# Patient Record
Sex: Male | Born: 1960 | Race: White | Hispanic: No | Marital: Married | Smoking: Never smoker
Health system: Southern US, Community
[De-identification: ages and names within clinical notes are randomized; demographics above are authoritative.]

## PROBLEM LIST (undated history)

## (undated) DIAGNOSIS — M5136 Other intervertebral disc degeneration, lumbar region: Secondary | ICD-10-CM

## (undated) DIAGNOSIS — Z86010 Personal history of colonic polyps: Principal | ICD-10-CM

## (undated) DIAGNOSIS — G4730 Sleep apnea, unspecified: Secondary | ICD-10-CM

## (undated) DIAGNOSIS — I10 Essential (primary) hypertension: Secondary | ICD-10-CM

## (undated) HISTORY — PX: COLONOSCOPY: SHX174

## (undated) HISTORY — DX: Personal history of colonic polyps: Z86.010

## (undated) HISTORY — DX: Sleep apnea, unspecified: G47.30

## (undated) HISTORY — DX: Essential (primary) hypertension: I10

## (undated) HISTORY — PX: CARPAL TUNNEL RELEASE: SHX101

## (undated) HISTORY — DX: Other intervertebral disc degeneration, lumbar region: M51.36

---

## 1997-11-18 ENCOUNTER — Ambulatory Visit (HOSPITAL_COMMUNITY): Admission: RE | Admit: 1997-11-18 | Discharge: 1997-11-18 | Payer: Self-pay

## 2001-11-30 ENCOUNTER — Ambulatory Visit (HOSPITAL_BASED_OUTPATIENT_CLINIC_OR_DEPARTMENT_OTHER): Admission: RE | Admit: 2001-11-30 | Discharge: 2001-11-30 | Payer: Self-pay

## 2003-01-22 ENCOUNTER — Ambulatory Visit (HOSPITAL_BASED_OUTPATIENT_CLINIC_OR_DEPARTMENT_OTHER): Admission: RE | Admit: 2003-01-22 | Discharge: 2003-01-22 | Payer: Self-pay | Admitting: Orthopedic Surgery

## 2003-01-22 ENCOUNTER — Ambulatory Visit (HOSPITAL_COMMUNITY): Admission: RE | Admit: 2003-01-22 | Discharge: 2003-01-22 | Payer: Self-pay | Admitting: Orthopedic Surgery

## 2003-02-22 HISTORY — PX: CHOLECYSTECTOMY: SHX55

## 2003-12-29 ENCOUNTER — Encounter: Admission: RE | Admit: 2003-12-29 | Discharge: 2003-12-29 | Payer: Self-pay | Admitting: Family Medicine

## 2005-06-22 ENCOUNTER — Encounter: Payer: Self-pay | Admitting: Cardiology

## 2012-07-17 ENCOUNTER — Encounter: Payer: Self-pay | Admitting: Internal Medicine

## 2012-07-25 ENCOUNTER — Ambulatory Visit
Admission: RE | Admit: 2012-07-25 | Discharge: 2012-07-25 | Disposition: A | Discharge status: Home / Self Care | Payer: BC Managed Care – PPO | Source: Ambulatory Visit | Attending: Family Medicine | Admitting: Family Medicine

## 2012-07-25 ENCOUNTER — Other Ambulatory Visit: Payer: Self-pay | Admitting: Family Medicine

## 2012-07-25 DIAGNOSIS — M545 Low back pain: Secondary | ICD-10-CM

## 2012-08-09 ENCOUNTER — Ambulatory Visit (AMBULATORY_SURGERY_CENTER): Payer: BC Managed Care – PPO | Admitting: *Deleted

## 2012-08-09 VITALS — Ht 69.0 in | Wt 322.4 lb

## 2012-08-09 DIAGNOSIS — Z1211 Encounter for screening for malignant neoplasm of colon: Secondary | ICD-10-CM

## 2012-08-09 MED ORDER — NA SULFATE-K SULFATE-MG SULF 17.5-3.13-1.6 GM/177ML PO SOLN: 1.0000 | Freq: Once | ORAL | Status: DC

## 2012-08-09 NOTE — Progress Notes (Signed)
Denies allergies to eggs or soy products. Denies complications with anesthesia or sedation. 

## 2012-08-10 ENCOUNTER — Encounter: Payer: Self-pay | Admitting: Internal Medicine

## 2012-08-23 ENCOUNTER — Ambulatory Visit: Payer: BC Managed Care – PPO | Admitting: Internal Medicine

## 2012-08-23 ENCOUNTER — Encounter: Payer: Self-pay | Admitting: Internal Medicine

## 2012-08-23 VITALS — BP 125/88 | HR 58 | Temp 97.3°F | Ht 69.0 in | Wt 322.0 lb

## 2012-08-23 MED ORDER — SODIUM CHLORIDE 0.9 % IV SOLN: 500.0000 mL | INTRAVENOUS | Status: DC

## 2012-08-23 NOTE — Progress Notes (Addendum)
Pt. Received call from wife during admission process, death of his mother. Dr. Leone Payor out to speak with pt.   He will reschedule at some point. Discharged with his son as care partner.  Agree Iva Boop, MD, [facility]

## 2012-10-03 ENCOUNTER — Encounter: Payer: Self-pay | Admitting: Internal Medicine

## 2012-12-27 ENCOUNTER — Telehealth: Payer: Self-pay

## 2012-12-27 NOTE — Telephone Encounter (Signed)
Left message on voice mail to call back so that we may set him up for a free pre-visit and colonoscopy with Dr. Stan Head.

## 2013-02-19 ENCOUNTER — Encounter: Payer: Self-pay | Admitting: Internal Medicine

## 2014-06-21 IMAGING — CR DG LUMBAR SPINE COMPLETE 4+V
5 series · 5 of 5 positions shown · non-contrast
Comparison: None.

CLINICAL DATA: Right-sided low back and right hip pain

LUMBAR SPINE - COMPLETE 4+ VIEW

[view not recorded (1 of 5)]
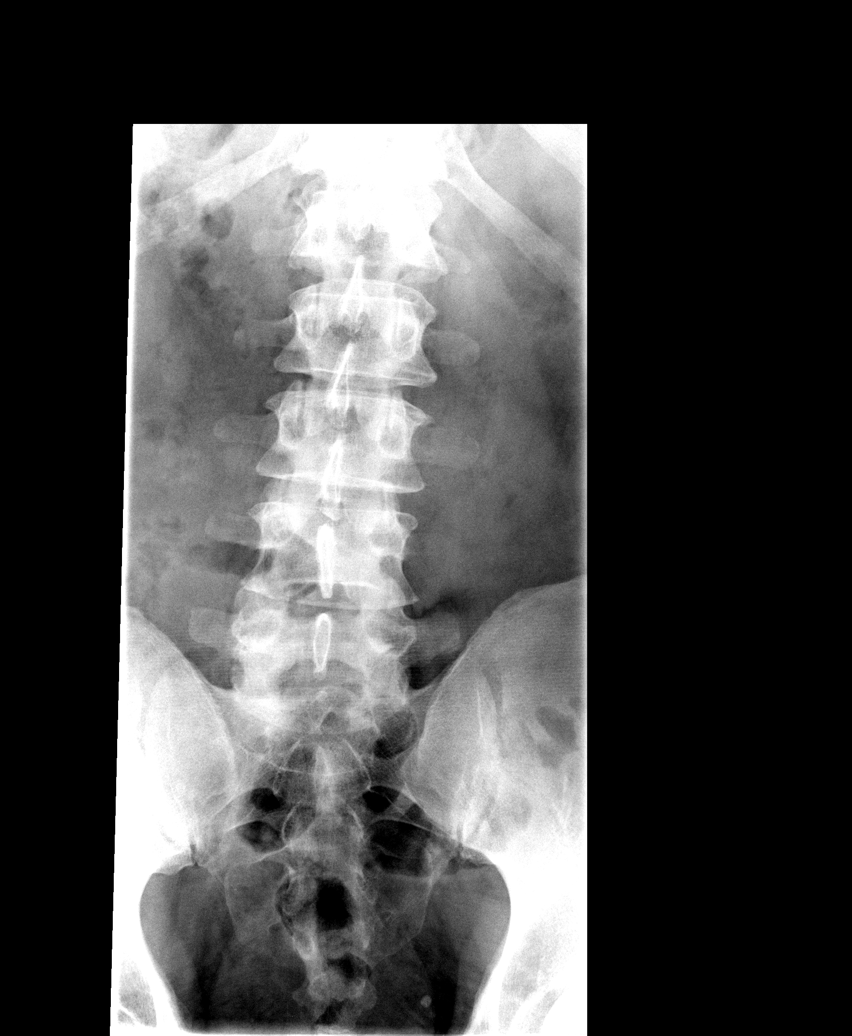

[view not recorded (2 of 5)]
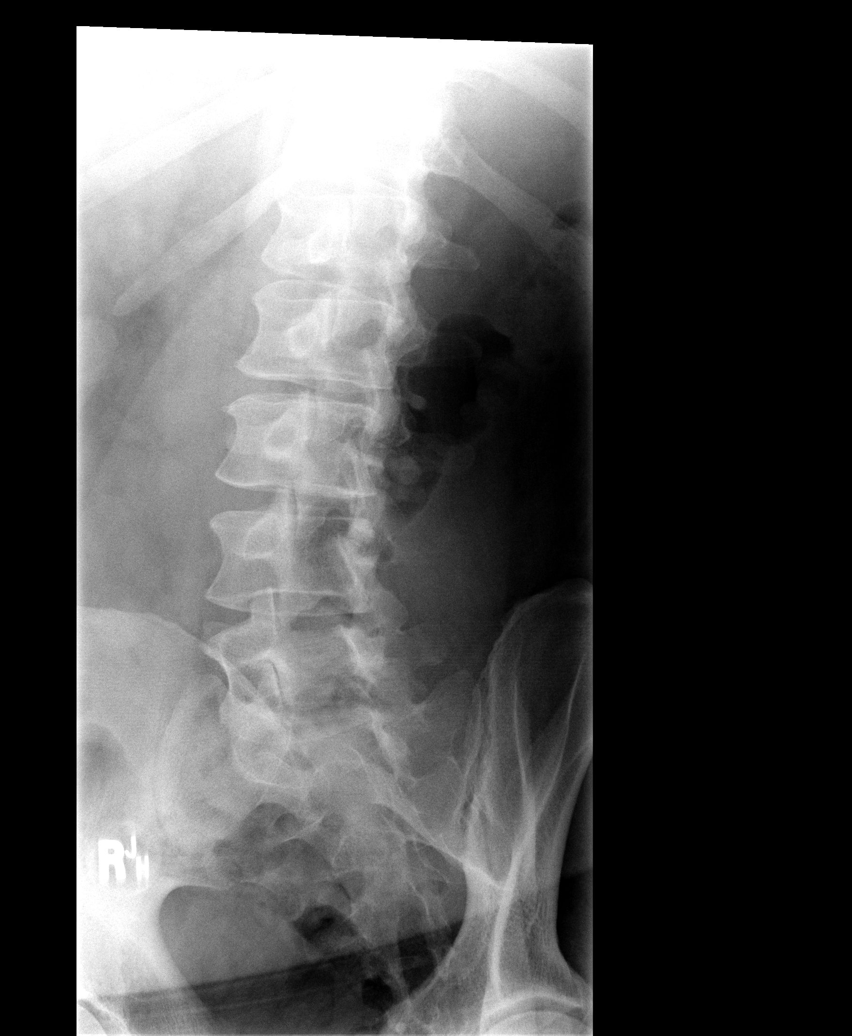

[view not recorded (3 of 5)]
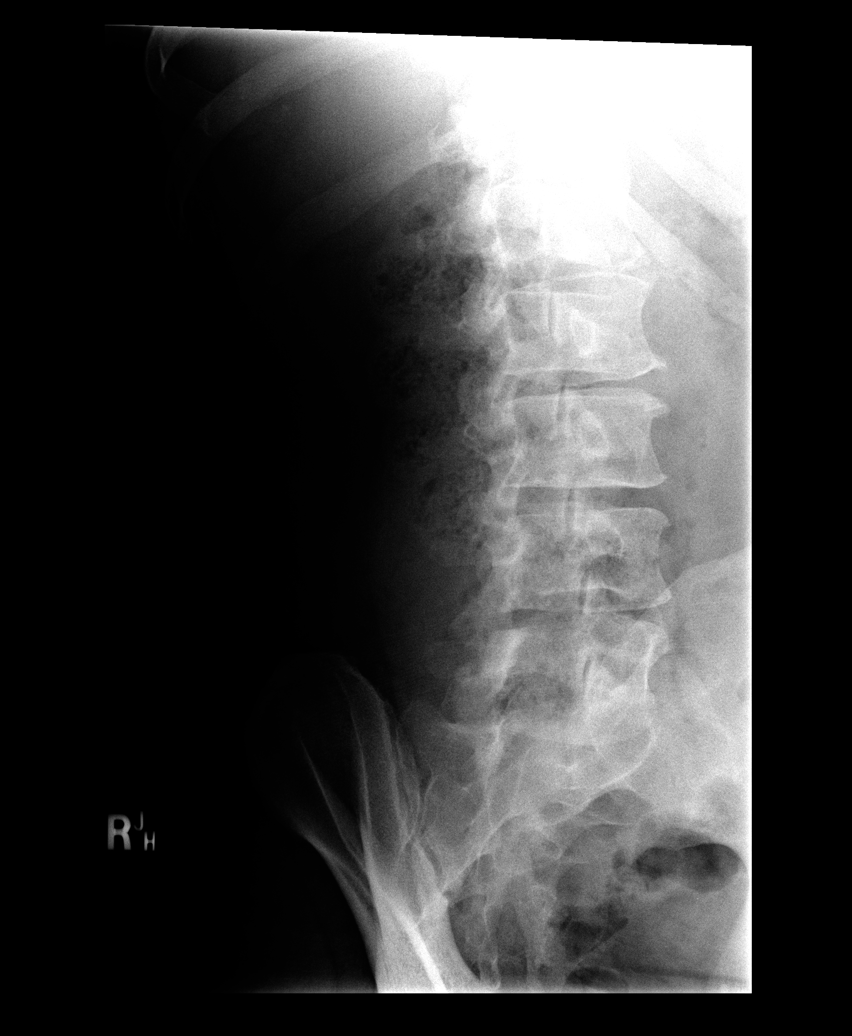

[view not recorded (4 of 5)]
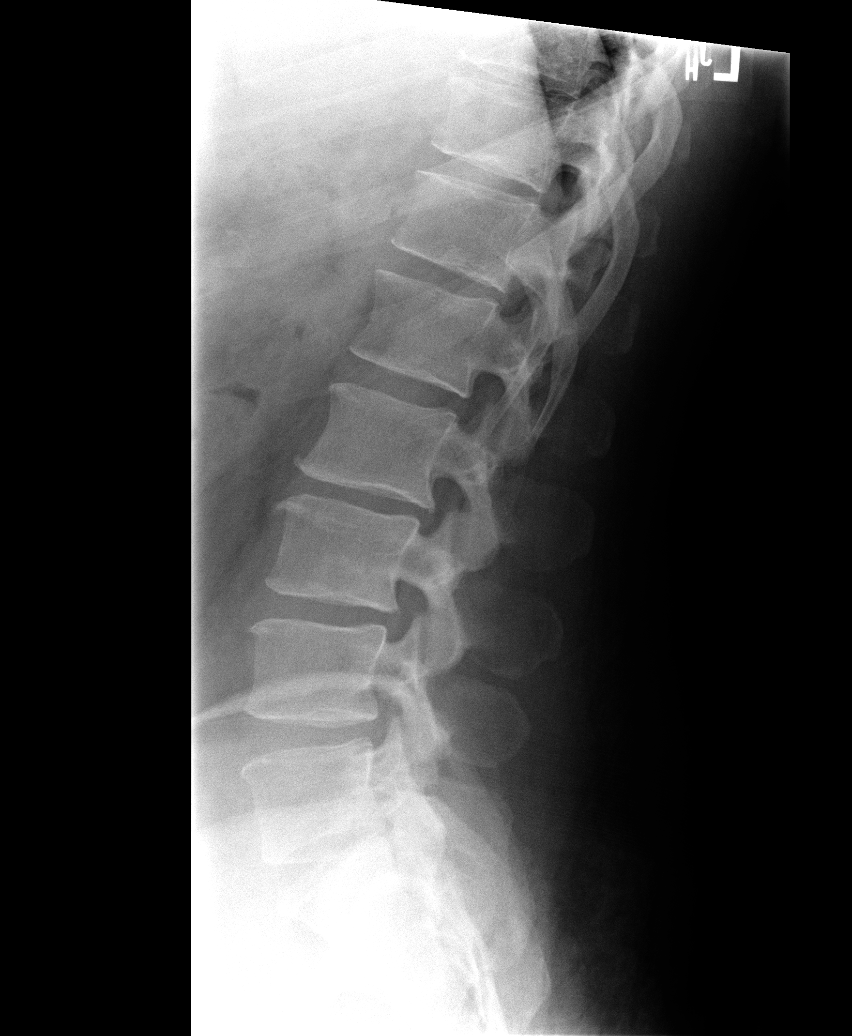

[view not recorded (5 of 5)]
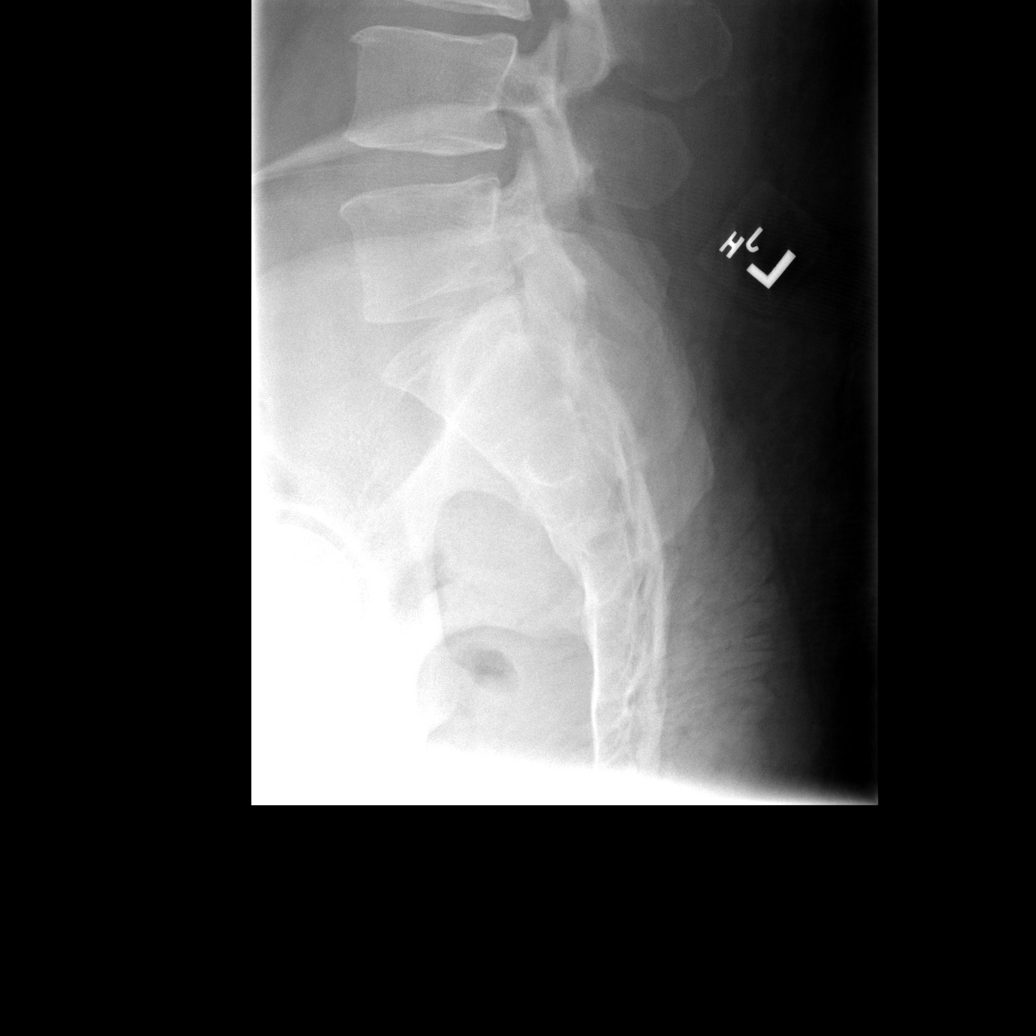

[5 of 5 positions shown; findings below may reference images not displayed]

FINDINGS: The lumbar vertebrae remain in normal alignment.
Intervertebral disc spaces are unremarkable with only minimal
degenerative change at the L2-3 level.  There is very mild
degenerative change also involving the facet joints of L5-S1.  The
SI joints are corticated.
IMPRESSION: 1.  Normal alignment with minimal degenerative disc disease at L2-
3.
2.  Mild degenerative change involves the facet joints of L5-S1

## 2014-10-03 ENCOUNTER — Encounter: Payer: Self-pay | Admitting: Internal Medicine

## 2014-10-13 ENCOUNTER — Ambulatory Visit (AMBULATORY_SURGERY_CENTER): Payer: Self-pay

## 2014-10-13 VITALS — Ht 69.0 in | Wt 322.0 lb

## 2014-10-13 DIAGNOSIS — Z1211 Encounter for screening for malignant neoplasm of colon: Secondary | ICD-10-CM

## 2014-10-13 NOTE — Progress Notes (Signed)
No allergies to eggs or soy No past problems with anesthesia No home oxygen No diet/weight loss meds  Has email  Emmi instructions given for colonoscopy 

## 2014-10-17 ENCOUNTER — Encounter: Payer: Self-pay | Admitting: Internal Medicine

## 2014-10-24 ENCOUNTER — Ambulatory Visit (AMBULATORY_SURGERY_CENTER): Payer: BLUE CROSS/BLUE SHIELD | Admitting: Internal Medicine

## 2014-10-24 ENCOUNTER — Encounter: Payer: Self-pay | Admitting: Internal Medicine

## 2014-10-24 VITALS — BP 144/80 | HR 73 | Temp 97.2°F | Resp 15 | Ht 69.0 in | Wt 322.0 lb

## 2014-10-24 DIAGNOSIS — Z1211 Encounter for screening for malignant neoplasm of colon: Secondary | ICD-10-CM | POA: Diagnosis present

## 2014-10-24 DIAGNOSIS — D124 Benign neoplasm of descending colon: Secondary | ICD-10-CM | POA: Diagnosis not present

## 2014-10-24 DIAGNOSIS — D125 Benign neoplasm of sigmoid colon: Secondary | ICD-10-CM

## 2014-10-24 DIAGNOSIS — D122 Benign neoplasm of ascending colon: Secondary | ICD-10-CM

## 2014-10-24 DIAGNOSIS — D123 Benign neoplasm of transverse colon: Secondary | ICD-10-CM

## 2014-10-24 MED ORDER — SODIUM CHLORIDE 0.9 % IV SOLN: 500.0000 mL | INTRAVENOUS | Status: DC

## 2014-10-24 NOTE — Progress Notes (Signed)
Called to room to assist during endoscopic procedure.  Patient ID and intended procedure confirmed with present staff. Received instructions for my participation in the procedure from the performing physician.  

## 2014-10-24 NOTE — Patient Instructions (Addendum)
I found and removed 5 polyps - all look benign.  I will let you know pathology results and when to have another routine colonoscopy by mail.  I appreciate the opportunity to care for you. Gatha Mayer, MD, FACG  YOU HAD AN ENDOSCOPIC PROCEDURE TODAY AT [facility]:   Refer to the procedure report that was given to you for any specific questions about what was found during the examination.  If the procedure report does not answer your questions, please call your gastroenterologist to clarify.  If you requested that your care partner not be given the details of your procedure findings, then the procedure report has been included in a sealed envelope for you to review at your convenience later.  YOU SHOULD EXPECT: Some feelings of bloating in the abdomen. Passage of more gas than usual.  Walking can help get rid of the air that was put into your GI tract during the procedure and reduce the bloating. If you had a lower endoscopy (such as a colonoscopy or flexible sigmoidoscopy) you may notice spotting of blood in your stool or on the toilet paper. If you underwent a bowel prep for your procedure, you may not have a normal bowel movement for a few days.  Please Note:  You might notice some irritation and congestion in your nose or some drainage.  This is from the oxygen used during your procedure.  There is no need for concern and it should clear up in a day or so.  SYMPTOMS TO REPORT IMMEDIATELY:   Following lower endoscopy (colonoscopy or flexible sigmoidoscopy):  Excessive amounts of blood in the stool  Significant tenderness or worsening of abdominal pains  Swelling of the abdomen that is new, acute  Fever of 100F or higher  For urgent or emergent issues, a gastroenterologist can be reached at any hour by calling [phone].   DIET: Your first meal following the procedure should be a small meal and then it is ok to progress to your normal diet. Heavy or fried  foods are harder to digest and may make you feel nauseous or bloated.  Likewise, meals heavy in dairy and vegetables can increase bloating.  Drink plenty of fluids but you should avoid alcoholic beverages for 24 hours.  ACTIVITY:  You should plan to take it easy for the rest of today and you should NOT DRIVE or use heavy machinery until tomorrow (because of the sedation medicines used during the test).    FOLLOW UP: Our staff will call the number listed on your records the next business day following your procedure to check on you and address any questions or concerns that you may have regarding the information given to you following your procedure. If we do not reach you, we will leave a message.  However, if you are feeling well and you are not experiencing any problems, there is no need to return our call.  We will assume that you have returned to your regular daily activities without incident.  If any biopsies were taken you will be contacted by phone or by letter within the next 1-3 weeks.  Please call us at [phone] if you have not heard about the biopsies in 3 weeks.   SIGNATURES/CONFIDENTIALITY: You and/or your care partner have signed paperwork which will be entered into your electronic medical record.  These signatures attest to the fact that that the information above on your After Visit Summary has been reviewed and is understood.  Full responsibility of the confidentiality of this discharge information lies with you and/or your care-partner.  No Aspirin, Aspirin containing products (BC or Goody powders),or NSAIDs (Advil, Aleve, Motrin, Ibuprofen) for 2 weeks- Tylenol is ok

## 2014-10-24 NOTE — Op Note (Addendum)
[place]  [address] [place], [id number]   COLONOSCOPY PROCEDURE REPORT  PATIENT: [name], [name]  MR#: [id number] BIRTHDATE: 07/01/1960 , 55  yrs. old GENDER: male ENDOSCOPIST: Gatha Mayer, MD, [facility] PROCEDURE DATE:  10/24/2014 PROCEDURE:   Colonoscopy with biopsy and Colonoscopy with snare polypectomy First Screening Colonoscopy - Avg.  risk and is 50 yrs.  old or older Yes.  Prior Negative Screening - Now for repeat screening. N/A  History of Adenoma - Now for follow-up colonoscopy & has been > or = to 3 yrs.  N/A  Polyps removed today? Yes ASA CLASS:   Class II INDICATIONS:Screening for colonic neoplasia and Colorectal Neoplasm Risk Assessment for this procedure is average risk. MEDICATIONS: Propofol 240 mg IV and Monitored anesthesia care  DESCRIPTION OF PROCEDURE:   After the risks benefits and alternatives of the procedure were thoroughly explained, informed consent was obtained.  The digital rectal exam revealed no abnormalities of the rectum, revealed no prostatic nodules, and revealed the prostate was not enlarged.   The LB [id number] endoscope was introduced through the anus and advanced to the cecum, which was identified by both the appendix and ileocecal valve. No adverse events experienced.   The quality of the prep was good.  (MiraLax was used)  The instrument was then slowly withdrawn as the colon was fully examined. Estimated blood loss is zero unless otherwise noted in this procedure report.    COLON FINDINGS: Five polypoid shaped polyps ranging from 2 to 34mm in size were found in the transverse colon, ascending colon, sigmoid colon, and descending colon.  Polypectomies were performed with cold forceps (asc x 1) , with a cold snare and using snare cautery (sigmoid pedunculated).  The resection was complete, the polyp tissue was completely retrieved and sent to histology.   The examination was otherwise normal.  Retroflexed views  revealed no abnormalities. The time to cecum = 3.0 Withdrawal time = 13.5   The scope was withdrawn and the procedure completed. COMPLICATIONS: There were no immediate complications.  ENDOSCOPIC IMPRESSION: 1.   Five polyps ranging from 2 to 26mm in size were found in the transverse colon, ascending colon, sigmoid colon, and descending colon; polypectomies were performed with cold forceps, with a cold snare and using snare cautery 2.   The examination was otherwise normal - good prep  RECOMMENDATIONS: Repeat colonoscopy pending pathology No aspirin/NSAID x 2 weeks  eSigned:  Gatha Mayer, MD, [facility] 10/24/2014 11:26 AM Revised: 10/24/2014 11:26 AM  cc: Daiva Eves, MD and The Patient

## 2014-10-24 NOTE — Progress Notes (Signed)
To recovery, report to Scott, RN, VSS 

## 2014-10-28 ENCOUNTER — Telehealth: Payer: Self-pay

## 2014-10-28 NOTE — Telephone Encounter (Signed)
  Follow up Call-  Call back number 10/24/2014 08/23/2012  Post procedure Call Back phone  # [phone] ext [phone]  Permission to leave phone message Yes Yes     Patient questions:  Do you have a fever, pain , or abdominal swelling? No. Pain Score  0 *  Have you tolerated food without any problems? Yes.    Have you been able to return to your normal activities? Yes.    Do you have any questions about your discharge instructions: Diet   No. Medications  No. Follow up visit  No.  Do you have questions or concerns about your Care? No.  Actions: * If pain score is 4 or above: No action needed, pain <4.

## 2014-11-03 ENCOUNTER — Encounter: Payer: Self-pay | Admitting: Internal Medicine

## 2014-11-03 DIAGNOSIS — Z86010 Personal history of colonic polyps: Secondary | ICD-10-CM

## 2014-11-03 HISTORY — DX: Personal history of colonic polyps: Z86.010

## 2014-11-03 NOTE — Progress Notes (Signed)
Quick Note:  5 adenomas max 10 mm - repeat colon 2019 ______

## 2014-12-24 ENCOUNTER — Ambulatory Visit (HOSPITAL_BASED_OUTPATIENT_CLINIC_OR_DEPARTMENT_OTHER): Payer: BLUE CROSS/BLUE SHIELD | Attending: Family Medicine | Admitting: Radiology

## 2014-12-24 VITALS — Ht 70.0 in | Wt 325.0 lb

## 2014-12-24 DIAGNOSIS — G4733 Obstructive sleep apnea (adult) (pediatric): Secondary | ICD-10-CM | POA: Diagnosis not present

## 2014-12-24 DIAGNOSIS — G4730 Sleep apnea, unspecified: Secondary | ICD-10-CM

## 2014-12-24 DIAGNOSIS — Z6841 Body Mass Index (BMI) 40.0 and over, adult: Secondary | ICD-10-CM | POA: Diagnosis not present

## 2014-12-24 DIAGNOSIS — R5383 Other fatigue: Secondary | ICD-10-CM | POA: Diagnosis not present

## 2014-12-24 DIAGNOSIS — R0683 Snoring: Secondary | ICD-10-CM | POA: Diagnosis not present

## 2014-12-24 DIAGNOSIS — R51 Headache: Secondary | ICD-10-CM | POA: Diagnosis not present

## 2014-12-24 DIAGNOSIS — E669 Obesity, unspecified: Secondary | ICD-10-CM | POA: Insufficient documentation

## 2014-12-27 DIAGNOSIS — G4730 Sleep apnea, unspecified: Secondary | ICD-10-CM | POA: Diagnosis not present

## 2014-12-27 DIAGNOSIS — R0683 Snoring: Secondary | ICD-10-CM | POA: Diagnosis not present

## 2014-12-27 NOTE — Progress Notes (Signed)
Patient Name: [name] [name], [name] [name] Date: 12/24/2014 Gender: Male D.O.B: [birth year] Age (years): 54 Referring Provider: Cristela Blue Hamrick Height (inches): 37 Interpreting Physician: Baird Lyons MD, ABSM Weight (lbs): 325 RPSGT: Laren Everts BMI: 19 MRN: [id number] Neck Size: 20.50 CLINICAL INFORMATION Sleep Study Type: Split Night CPAP Indication for sleep study: Fatigue, Morning Headaches, Obesity, OSA, Re-Evaluation, Snoring, Witnessed Apneas Epworth Sleepiness Score: 14  SLEEP STUDY TECHNIQUE As per the AASM Manual for the Scoring of Sleep and Associated Events v2.3 (April 2016) with a hypopnea requiring 4% desaturations. The channels recorded and monitored were frontal, central and occipital EEG, electrooculogram (EOG), submentalis EMG (chin), nasal and oral airflow, thoracic and abdominal wall motion, anterior tibialis EMG, snore microphone, electrocardiogram, and pulse oximetry. Continuous positive airway pressure (CPAP) was initiated when the patient met split night criteria and was titrated according to treat sleep-disordered breathing.  MEDICATIONS Medications taken by the patient :charted for review Medications administered by patient during sleep study : Sleep medicine administered - Ambien 10 mg at 10:13:30 PM  RESPIRATORY PARAMETERS Diagnostic Total AHI (/hr): 32.9 RDI (/hr): 44.6 OA Index (/hr): 2.2 CA Index (/hr): 0.4 REM AHI (/hr): 40.0 NREM AHI (/hr): 32.6 Supine AHI (/hr): 38.2 Non-supine AHI (/hr): 9.42 Min O2 Sat (%): 80.00 Mean O2 (%): 91.88 Time below 88% (min): 4.7   Titration Optimal Pressure (cm): 13 AHI at Optimal Pressure (/hr): 0.0 Min O2 at Optimal Pressure (%): 91.0 Supine % at Optimal (%): 100 Sleep % at Optimal (%): 92    SLEEP ARCHITECTURE The recording time for the entire night was 409.1 minutes. During a baseline period of 176.4 minutes, the patient slept for 138.5 minutes in REM and nonREM, yielding a sleep efficiency of 78.5%. Sleep onset  after lights out was 9.4 minutes with a REM latency of 133.0 minutes. The patient spent 23.11% of the night in stage N1 sleep, 72.56% in stage N2 sleep, 0.00% in stage N3 and 4.33% in REM. During the titration period of 224.3 minutes, the patient slept for 206.5 minutes in REM and nonREM, yielding a sleep efficiency of 92.1%. Sleep onset after CPAP initiation was 6.2 minutes with a REM latency of 51.5 minutes. The patient spent 10.41% of the night in stage N1 sleep, 69.98% in stage N2 sleep, 0.00% in stage N3 and 19.61% in REM.  CARDIAC DATA The 2 lead EKG demonstrated sinus rhythm. The mean heart rate was 70.99 beats per minute. Other EKG findings include: None.  LEG MOVEMENT DATA The total Periodic Limb Movements of Sleep (PLMS) were 0. The PLMS index was 0.00 .  IMPRESSIONS - Severe obstructive sleep apnea occurred during the diagnostic portion of the study (AHI = 32.9/hour). An optimal PAP pressure was selected for this patient ( 13 cm of water) - No significant central sleep apnea occurred during the diagnostic portion of the study (CAI = 0.4/hour). - Moderate oxygen desaturation was noted during the diagnostic portion of the study (Min O2 =80.00%). - The patient snored with Moderate snoring volume during the diagnostic portion of the study. - No cardiac abnormalities were noted during this study. - Clinically significant periodic limb movements did not occur during sleep.  DIAGNOSIS - Obstructive Sleep Apnea (327.23 [G47.33 ICD-10])  RECOMMENDATIONS - Trial of CPAP therapy on 13 cm H2O with a Medium size Fisher&Paykel Full Face Mask Simplus mask and heated humidification. - Avoid alcohol, sedatives and other CNS depressants that may worsen sleep apnea and disrupt normal sleep architecture. - Sleep hygiene should be reviewed to assess  factors that may improve sleep quality. - Weight management and regular exercise should be initiated or continued.  Deneise Lever Diplomate, American  Board of Sleep Medicine  ELECTRONICALLY SIGNED ON:  12/27/2014, 12:49 PM [place] PH: [phone]   FX: [phone] [place]

## 2017-10-17 ENCOUNTER — Other Ambulatory Visit: Payer: Self-pay | Admitting: Orthopedic Surgery

## 2017-10-26 ENCOUNTER — Other Ambulatory Visit: Payer: Self-pay | Admitting: Orthopedic Surgery

## 2018-01-05 ENCOUNTER — Encounter (HOSPITAL_BASED_OUTPATIENT_CLINIC_OR_DEPARTMENT_OTHER)
Admission: RE | Admit: 2018-01-05 | Discharge: 2018-01-05 | Disposition: A | Discharge status: Home / Self Care | Payer: Managed Care, Other (non HMO) | Source: Ambulatory Visit | Attending: Orthopedic Surgery | Admitting: Orthopedic Surgery

## 2018-01-05 ENCOUNTER — Encounter (HOSPITAL_BASED_OUTPATIENT_CLINIC_OR_DEPARTMENT_OTHER): Payer: Self-pay | Admitting: *Deleted

## 2018-01-05 ENCOUNTER — Other Ambulatory Visit: Payer: Self-pay

## 2018-01-05 DIAGNOSIS — Z01818 Encounter for other preprocedural examination: Secondary | ICD-10-CM | POA: Insufficient documentation

## 2018-01-05 DIAGNOSIS — I10 Essential (primary) hypertension: Secondary | ICD-10-CM | POA: Diagnosis not present

## 2018-01-05 LAB — BASIC METABOLIC PANEL
ANION GAP: 9 (ref 5–15)
BUN: 11 mg/dL (ref 6–20)
CO2: 24 mmol/L (ref 22–32)
Calcium: 8.8 mg/dL — ABNORMAL LOW (ref 8.9–10.3)
Chloride: 106 mmol/L (ref 98–111)
Creatinine, Ser: 0.95 mg/dL (ref 0.61–1.24)
Glucose, Bld: 129 mg/dL — ABNORMAL HIGH (ref 70–99)
POTASSIUM: 3.9 mmol/L (ref 3.5–5.1)
SODIUM: 139 mmol/L (ref 135–145)

## 2018-01-12 ENCOUNTER — Ambulatory Visit (HOSPITAL_BASED_OUTPATIENT_CLINIC_OR_DEPARTMENT_OTHER)
Admission: RE | Admit: 2018-01-12 | Discharge: 2018-01-12 | Disposition: A | Discharge status: Home / Self Care | Payer: Managed Care, Other (non HMO) | Source: Ambulatory Visit | Attending: Orthopedic Surgery | Admitting: Orthopedic Surgery

## 2018-01-12 ENCOUNTER — Encounter (HOSPITAL_BASED_OUTPATIENT_CLINIC_OR_DEPARTMENT_OTHER)
Admission: RE | Disposition: A | Discharge status: Home / Self Care | Payer: Self-pay | Source: Ambulatory Visit | Attending: Orthopedic Surgery

## 2018-01-12 ENCOUNTER — Other Ambulatory Visit: Payer: Self-pay

## 2018-01-12 ENCOUNTER — Ambulatory Visit (HOSPITAL_BASED_OUTPATIENT_CLINIC_OR_DEPARTMENT_OTHER): Payer: Managed Care, Other (non HMO) | Admitting: Anesthesiology

## 2018-01-12 ENCOUNTER — Encounter (HOSPITAL_BASED_OUTPATIENT_CLINIC_OR_DEPARTMENT_OTHER): Payer: Self-pay

## 2018-01-12 DIAGNOSIS — G4730 Sleep apnea, unspecified: Secondary | ICD-10-CM | POA: Insufficient documentation

## 2018-01-12 DIAGNOSIS — G5602 Carpal tunnel syndrome, left upper limb: Secondary | ICD-10-CM | POA: Diagnosis present

## 2018-01-12 HISTORY — PX: CARPAL TUNNEL RELEASE: SHX101

## 2018-01-12 SURGERY — CARPAL TUNNEL RELEASE
Anesthesia: Monitor Anesthesia Care | Laterality: Left

## 2018-01-12 MED ORDER — PROPOFOL 500 MG/50ML IV EMUL
INTRAVENOUS | Status: AC
  Filled 2018-01-12: qty 50

## 2018-01-12 MED ORDER — FENTANYL CITRATE (PF) 100 MCG/2ML IJ SOLN: 25.0000 ug | INTRAMUSCULAR | Status: DC | PRN

## 2018-01-12 MED ORDER — DEXTROSE 5 % IV SOLN
3.0000 g | INTRAVENOUS | Status: AC
  Administered 2018-01-12: 3 g via INTRAVENOUS

## 2018-01-12 MED ORDER — FENTANYL CITRATE (PF) 100 MCG/2ML IJ SOLN
INTRAMUSCULAR | Status: AC
  Filled 2018-01-12: qty 2

## 2018-01-12 MED ORDER — HYDROCODONE-ACETAMINOPHEN 5-325 MG PO TABS: 1.0000 | ORAL_TABLET | Freq: Four times a day (QID) | ORAL | 0 refills | Status: AC | PRN

## 2018-01-12 MED ORDER — SCOPOLAMINE 1 MG/3DAYS TD PT72: 1.0000 | MEDICATED_PATCH | Freq: Once | TRANSDERMAL | Status: DC | PRN

## 2018-01-12 MED ORDER — ONDANSETRON HCL 4 MG/2ML IJ SOLN
INTRAMUSCULAR | Status: DC | PRN
  Administered 2018-01-12: 4 mg via INTRAVENOUS

## 2018-01-12 MED ORDER — ACETAMINOPHEN 325 MG PO TABS: 325.0000 mg | ORAL_TABLET | ORAL | Status: DC | PRN

## 2018-01-12 MED ORDER — ACETAMINOPHEN 160 MG/5ML PO SOLN: 325.0000 mg | ORAL | Status: DC | PRN

## 2018-01-12 MED ORDER — BUPIVACAINE HCL (PF) 0.25 % IJ SOLN
INTRAMUSCULAR | Status: DC | PRN
  Administered 2018-01-12: 10 mL

## 2018-01-12 MED ORDER — ONDANSETRON HCL 4 MG/2ML IJ SOLN: 4.0000 mg | Freq: Once | INTRAMUSCULAR | Status: DC | PRN

## 2018-01-12 MED ORDER — FENTANYL CITRATE (PF) 100 MCG/2ML IJ SOLN
50.0000 ug | INTRAMUSCULAR | Status: DC | PRN
  Administered 2018-01-12: 50 ug via INTRAVENOUS

## 2018-01-12 MED ORDER — MIDAZOLAM HCL 2 MG/2ML IJ SOLN
INTRAMUSCULAR | Status: AC
  Filled 2018-01-12: qty 2

## 2018-01-12 MED ORDER — PROPOFOL 500 MG/50ML IV EMUL
INTRAVENOUS | Status: DC | PRN
  Administered 2018-01-12: 50 ug/kg/min via INTRAVENOUS

## 2018-01-12 MED ORDER — CEFAZOLIN SODIUM-DEXTROSE 1-4 GM/50ML-% IV SOLN
INTRAVENOUS | Status: AC
  Filled 2018-01-12: qty 50

## 2018-01-12 MED ORDER — MEPERIDINE HCL 25 MG/ML IJ SOLN: 6.2500 mg | INTRAMUSCULAR | Status: DC | PRN

## 2018-01-12 MED ORDER — 0.9 % SODIUM CHLORIDE (POUR BTL) OPTIME
TOPICAL | Status: DC | PRN
  Administered 2018-01-12: 1000 mL

## 2018-01-12 MED ORDER — LIDOCAINE HCL (PF) 0.5 % IJ SOLN
INTRAMUSCULAR | Status: DC | PRN
  Administered 2018-01-12: 45 mL via INTRAVENOUS

## 2018-01-12 MED ORDER — OXYCODONE HCL 5 MG PO TABS: 5.0000 mg | ORAL_TABLET | Freq: Once | ORAL | Status: DC | PRN

## 2018-01-12 MED ORDER — LACTATED RINGERS IV SOLN
INTRAVENOUS | Status: DC
  Administered 2018-01-12: 13:00:00 via INTRAVENOUS

## 2018-01-12 MED ORDER — ONDANSETRON HCL 4 MG/2ML IJ SOLN
INTRAMUSCULAR | Status: AC
  Filled 2018-01-12: qty 2

## 2018-01-12 MED ORDER — CHLORHEXIDINE GLUCONATE 4 % EX LIQD: 60.0000 mL | Freq: Once | CUTANEOUS | Status: DC

## 2018-01-12 MED ORDER — CEFAZOLIN SODIUM-DEXTROSE 2-4 GM/100ML-% IV SOLN
INTRAVENOUS | Status: AC
  Filled 2018-01-12: qty 100

## 2018-01-12 MED ORDER — OXYCODONE HCL 5 MG/5ML PO SOLN: 5.0000 mg | Freq: Once | ORAL | Status: DC | PRN

## 2018-01-12 MED ORDER — MIDAZOLAM HCL 2 MG/2ML IJ SOLN
1.0000 mg | INTRAMUSCULAR | Status: DC | PRN
  Administered 2018-01-12: 1 mg via INTRAVENOUS

## 2018-01-12 SURGICAL SUPPLY — 33 items
BLADE SURG 15 STRL LF DISP TIS (BLADE) ×1 IMPLANT
BLADE SURG 15 STRL SS (BLADE) ×2
BNDG CMPR 9X4 STRL LF SNTH (GAUZE/BANDAGES/DRESSINGS) ×1
BNDG COHESIVE 3X5 TAN STRL LF (GAUZE/BANDAGES/DRESSINGS) ×2 IMPLANT
BNDG ESMARK 4X9 LF (GAUZE/BANDAGES/DRESSINGS) ×2 IMPLANT
BNDG GAUZE ELAST 4 BULKY (GAUZE/BANDAGES/DRESSINGS) ×2 IMPLANT
CHLORAPREP W/TINT 26ML (MISCELLANEOUS) ×2 IMPLANT
CORD BIPOLAR FORCEPS 12FT (ELECTRODE) ×2 IMPLANT
COVER BACK TABLE 60X90IN (DRAPES) ×2 IMPLANT
COVER MAYO STAND STRL (DRAPES) ×2 IMPLANT
COVER WAND RF STERILE (DRAPES) IMPLANT
CUFF TOURNIQUET SINGLE 18IN (TOURNIQUET CUFF) ×2 IMPLANT
DRAPE EXTREMITY T 121X128X90 (DRAPE) ×2 IMPLANT
DRAPE SURG 17X23 STRL (DRAPES) ×2 IMPLANT
DRSG PAD ABDOMINAL 8X10 ST (GAUZE/BANDAGES/DRESSINGS) ×2 IMPLANT
GAUZE SPONGE 4X4 12PLY STRL (GAUZE/BANDAGES/DRESSINGS) ×2 IMPLANT
GAUZE XEROFORM 1X8 LF (GAUZE/BANDAGES/DRESSINGS) ×2 IMPLANT
GLOVE BIOGEL PI IND STRL 8.5 (GLOVE) ×1 IMPLANT
GLOVE BIOGEL PI INDICATOR 8.5 (GLOVE) ×1
GLOVE SURG ORTHO 8.0 STRL STRW (GLOVE) ×2 IMPLANT
GOWN STRL REUS W/ TWL LRG LVL3 (GOWN DISPOSABLE) ×1 IMPLANT
GOWN STRL REUS W/TWL LRG LVL3 (GOWN DISPOSABLE) ×2
GOWN STRL REUS W/TWL XL LVL3 (GOWN DISPOSABLE) ×2 IMPLANT
NEEDLE PRECISIONGLIDE 27X1.5 (NEEDLE) ×2 IMPLANT
NS IRRIG 1000ML POUR BTL (IV SOLUTION) ×2 IMPLANT
PACK BASIN DAY SURGERY FS (CUSTOM PROCEDURE TRAY) ×2 IMPLANT
STOCKINETTE 4X48 STRL (DRAPES) ×2 IMPLANT
SUT ETHILON 4 0 PS 2 18 (SUTURE) ×2 IMPLANT
SUT VICRYL 4-0 PS2 18IN ABS (SUTURE) IMPLANT
SYR BULB 3OZ (MISCELLANEOUS) ×2 IMPLANT
SYR CONTROL 10ML LL (SYRINGE) ×2 IMPLANT
TOWEL GREEN STERILE FF (TOWEL DISPOSABLE) ×2 IMPLANT
UNDERPAD 30X30 (UNDERPADS AND DIAPERS) ×2 IMPLANT

## 2018-01-12 NOTE — Transfer of Care (Signed)
Immediate Anesthesia Transfer of Care Note  Patient: [name] [name]  Procedure(s) Performed: LEFT CARPAL TUNNEL RELEASE (Left )  Patient Location: PACU  Anesthesia Type:MAC and Bier block  Level of Consciousness: awake, alert  and oriented  Airway & Oxygen Therapy: Patient Spontanous Breathing and Patient connected to face mask oxygen  Post-op Assessment: Report given to RN and Post -op Vital signs reviewed and stable  Post vital signs: Reviewed and stable  Last Vitals:  Vitals Value Taken Time  BP    Temp    Pulse 58 01/12/2018  1:27 PM  Resp 13 01/12/2018  1:27 PM  SpO2 99 % 01/12/2018  1:27 PM  Vitals shown include unvalidated device data.  Last Pain:  Vitals:   01/12/18 1242  TempSrc: Oral      Patients Stated Pain Goal: 3 (55/83/16 7425)  Complications: No apparent anesthesia complications

## 2018-01-12 NOTE — Discharge Instructions (Addendum)

## 2018-01-12 NOTE — Op Note (Signed)
NAME: [name] [name] MEDICAL RECORD NO: [id number] DATE OF BIRTH: Jul 06, 1960 FACILITY[facility] LOCATION: [facility] PHYSICIAN: Wynonia Sours, MD   OPERATIVE REPORT   DATE OF PROCEDURE: 01/12/18    PREOPERATIVE DIAGNOSIS:   Carpal tunnel syndrome left hand   POSTOPERATIVE DIAGNOSIS:   Same   PROCEDURE:   Decompression median nerve left hand   SURGEON: Daryll Brod, M.D.   ASSISTANT: none   ANESTHESIA:  Bier block with sedation and Local   INTRAVENOUS FLUIDS:  Per anesthesia flow sheet.   ESTIMATED BLOOD LOSS:  Minimal.   COMPLICATIONS:  None.   SPECIMENS:  none   TOURNIQUET TIME:    Total Tourniquet Time Documented: Forearm (Left) - 20 minutes Total: Forearm (Left) - 20 minutes    DISPOSITION:  Stable to PACU.   INDICATIONS: Patient is a 57 year old male with history of numbness and tingling left hand.  Nerve conductions are positive.  This is not responded to conservative treatment.  He has elected to undergo surgical decompression of the median nerve left hand.  Pre-peri-and postoperative course been discussed along with risks and complications.  He is aware that there is no guarantee to the surgery the possibility of infection recurrence injury to arteries nerves tendons and complete relief of symptoms and dystrophy.  In the preoperative area the patient is seen extremity marked by both patient and surgeon antibiotic given  OPERATIVE COURSE: Patient is brought to the operating room where a forearm-based IV regional anesthetic was carried out without difficulty under the direction of the anesthesia department.  Was prepped using ChloraPrep in the supine position with left arm free.  A three-minute dry time was allowed in the hip and a timeout taken confirming patient procedure.  After adequate anesthesia was afforded.  A longitudinal incision was made left palm carried down through subcutaneous tissue.  Bleeders were electrocauterized with bipolar.  The palmar  fascia was split.  The superficial palmar arch was identified along with the flexor tendon to the ring little finger.  Retractors were placed retracting median nerve radially and the ulnar nerve ulnarly.  The flexor retinaculum was then released on its ulnar aspect.  A right angled saw retractor placed between skin and forearm fascia.  The proximal portion of the flexor retinaculum distal forearm fascia was then released for approximately 2 to 3 cm proximal to the wrist crease under direct vision.  The canal was explored.  An area compression of the nerve was immediately apparent with a significant hyperemia to the nerve.  Motor branch entered in the muscle distally.  No further lesions were identified.  The wound was copiously irrigated with saline.  The skin was closed with interrupted 4-0 nylon sutures.  Local infiltration with quarter percent bupivacaine without epinephrine was given approximately 10 cc was used.  A sterile compressive dressing with the fingers free was applied.  Inflation of the tourniquet all fingers immediately pink.  He was taken to the recovery room for observation in satisfactory condition.  He will be discharged home to return to [facility] in 1 week on Tylenol ibuprofen for pain with Norco for breakthrough.   Daryll Brod, MD Electronically signed, 01/12/18

## 2018-01-12 NOTE — Anesthesia Preprocedure Evaluation (Signed)
Anesthesia Evaluation  Patient identified by MRN, date of birth, ID band Patient awake    Reviewed: Allergy & Precautions, H&P , NPO status , Patient's Chart, lab work & pertinent test results, reviewed documented beta blocker date and time   Airway Mallampati: II  TM Distance: >3 FB Neck ROM: full    Dental no notable dental hx.    Pulmonary neg pulmonary ROS,    Pulmonary exam normal breath sounds clear to auscultation       Cardiovascular Exercise Tolerance: Good negative cardio ROS   Rhythm:regular Rate:Normal     Neuro/Psych negative neurological ROS  negative psych ROS   GI/Hepatic negative GI ROS, Neg liver ROS,   Endo/Other  negative endocrine ROS  Renal/GU negative Renal ROS  negative genitourinary   Musculoskeletal   Abdominal   Peds  Hematology negative hematology ROS (+)   Anesthesia Other Findings   Reproductive/Obstetrics negative OB ROS                             Anesthesia Physical Anesthesia Plan  ASA: II  Anesthesia Plan: Bier Block and MAC and Bier Block-LIDOCAINE ONLY   Post-op Pain Management:    Induction:   PONV Risk Score and Plan: 1 and Ondansetron and Treatment may vary due to age or medical condition  Airway Management Planned: Nasal Cannula, Natural Airway and Mask  Additional Equipment:   Intra-op Plan:   Post-operative Plan:   Informed Consent: I have reviewed the patients History and Physical, chart, labs and discussed the procedure including the risks, benefits and alternatives for the proposed anesthesia with the patient or authorized representative who has indicated his/her understanding and acceptance.   Dental Advisory Given  Plan Discussed with: CRNA, Anesthesiologist and Surgeon  Anesthesia Plan Comments:         Anesthesia Quick Evaluation

## 2018-01-12 NOTE — Anesthesia Procedure Notes (Signed)
Anesthesia Regional Block: Bier block (IV Regional)   Pre-Anesthetic Checklist: ,, timeout performed, Correct Patient, Correct Site, Correct Laterality, Correct Procedure, Correct Position, site marked, Risks and benefits discussed,  Surgical consent,  Pre-op evaluation,  At surgeon's request and post-op pain management  Laterality: Left  Prep: alcohol swabs        Procedures:,,,,,,,, #20gu IV placed  Narrative:  Anesthesiologist: Janeece Riggers, MD CRNA: Verita Lamb, CRNA

## 2018-01-12 NOTE — Brief Op Note (Signed)
01/12/2018  1:23 PM  PATIENT:  [name] [name]  57 y.o. male  PRE-OPERATIVE DIAGNOSIS:  LEFT CARPAL TUNNEL SYNDROME  POST-OPERATIVE DIAGNOSIS:  LEFT CARPAL TUNNEL SYNDROME  PROCEDURE:  Procedure(s): LEFT CARPAL TUNNEL RELEASE (Left)  SURGEON:  Surgeon(s) and Role:    * Daryll Brod, MD - Primary  PHYSICIAN ASSISTANT:   ASSISTANTS: none   ANESTHESIA:   local, regional and IV sedation  EBL: 30mlBLOOD ADMINISTERED:none  DRAINS: none   LOCAL MEDICATIONS USED:  BUPIVICAINE   SPECIMEN:  No Specimen  DISPOSITION OF SPECIMEN:  N/A  COUNTS:  YES  TOURNIQUET:   Total Tourniquet Time Documented: Forearm (Left) - 20 minutes Total: Forearm (Left) - 20 minutes   DICTATION: .Dragon Dictation  PLAN OF CARE: Discharge to home after PACU  PATIENT DISPOSITION:  PACU - hemodynamically stable.

## 2018-01-12 NOTE — H&P (Signed)
[name] [name] is an 57 y.o. male.   Chief Complaint: numbness left handHPI: [name] [name] is a 57 year old right-hand-dominant male who is seen complaining of numbness and tingling of his left hand thumb to ring finger constant since March. This apparently has been going on for several years. He had a carpal tunnel release done in this office 15 to 18 years ago with positive nerve conduction bilaterally. He states that the numbness and tingling is constant. He states it is gradually improved probably over the past year. Is also complaining of pain at the basilar areas of the thumbs bilaterally with a shooting pain VAS score 5-6 over 10 especially with gripping pinching he states driving because his hands go numb. He has not had any treatment other than trying some ibuprofen. He is awakened several nights a week but it has CPAP. He has no history of injury to the hand or to the neck. He has no history of diabetes thyroid problems arthritis or gout. Family history    Past Medical History:  Diagnosis Date  . Disc degeneration, lumbar   . Hx of adenomatous colonic polyps 11/03/2014  . Sleep apnea    no CPAP at this time    Past Surgical History:  Procedure Laterality Date  . CHOLECYSTECTOMY  2005    Family History  Problem Relation Age of Onset  . Heart disease Father   . Colon cancer Neg Hx   . Esophageal cancer Neg Hx   . Rectal cancer Neg Hx   . Stomach cancer Neg Hx    Social History:  reports that he has never smoked. He has never used smokeless tobacco. He reports that he drinks alcohol. He reports that he does not use drugs.  Allergies: No Known Allergies  No medications prior to admission.    No results found for this or any previous visit (from the past 48 hour(s)).  No results found.   Pertinent items are noted in HPI.  Height 5\' 9"  (1.753 m), weight 133.8 kg.  General appearance: alert, cooperative and appears stated age Head: Normocephalic, without obvious abnormality Neck:  no JVD Resp: clear to auscultation bilaterally Cardio: regular rate and rhythm, S1, S2 normal, no murmur, click, rub or gallop GI: soft, non-tender; bowel sounds normal; no masses,  no organomegaly Extremities: numbness left hand Pulses: 2+ and symmetric Skin: Skin color, texture, turgor normal. No rashes or lesions Neurologic: Grossly normal Incision/Wound: na  Assessment/Plan 1. Carpal tunnel syndrome of right wrist    Plan: He would like to have this surgically released. Pre-peri-and postoperative course are discussed along with risks and complications. He is aware that there is no guarantee to the surgery the possibility of infection recurrence injury to arteries nerves tendons incomplete relief symptoms dystrophy are all discussed with him. Pre-peri-and postoperative course are discussed along with risk complications. Would like to proceed and will be scheduled for carpal tunnel release in outpatient under regional anesthesia. He is warned about no open wounds or infections which will result in cancellation of his surgery until these are healed.   Daryll Brod 01/12/2018, 8:32 AM

## 2018-01-12 NOTE — Anesthesia Postprocedure Evaluation (Signed)
Anesthesia Post Note  Patient: [name] [name]  Procedure(s) Performed: LEFT CARPAL TUNNEL RELEASE (Left )     Patient location during evaluation: PACU Anesthesia Type: MAC Level of consciousness: awake and alert Pain management: pain level controlled Vital Signs Assessment: post-procedure vital signs reviewed and stable Respiratory status: spontaneous breathing, nonlabored ventilation, respiratory function stable and patient connected to nasal cannula oxygen Cardiovascular status: stable and blood pressure returned to baseline Postop Assessment: no apparent nausea or vomiting Anesthetic complications: no    Last Vitals:  Vitals:   01/12/18 1343 01/12/18 1350  BP: 113/66 118/75  Pulse: (!) 57 (!) 57  Resp: 11 16  Temp:  (!) 36.4 C  SpO2: 98% 99%    Last Pain:  Vitals:   01/12/18 1350  TempSrc:   PainSc: 0-No pain                 Cherise Fedder

## 2018-01-15 ENCOUNTER — Encounter (HOSPITAL_BASED_OUTPATIENT_CLINIC_OR_DEPARTMENT_OTHER): Payer: Self-pay | Admitting: Orthopedic Surgery

## 2018-02-26 ENCOUNTER — Encounter: Payer: Self-pay | Admitting: Internal Medicine

## 2019-03-08 ENCOUNTER — Encounter: Payer: Self-pay | Admitting: Internal Medicine

## 2019-03-19 ENCOUNTER — Other Ambulatory Visit: Payer: Self-pay

## 2019-03-19 ENCOUNTER — Ambulatory Visit: Payer: Managed Care, Other (non HMO) | Admitting: *Deleted

## 2019-03-19 VITALS — Ht 70.0 in | Wt 310.0 lb

## 2019-03-19 DIAGNOSIS — Z860100 Personal history of colon polyps, unspecified: Secondary | ICD-10-CM

## 2019-03-19 NOTE — Progress Notes (Signed)
Pt's previsit is done over the phone and all paperwork (prep instructions, blank consent form to just read over, pre-procedure acknowledgement form and stamped envelope) sent to patient  Pt is aware that care partner will wait in the car during procedure; if they feel like they will be too hot or cold to wait in the car; they may wait in the 4 th floor lobby. Patient is aware to bring only one care partner. We want them to wear a mask (we do not have any that we can provide them), practice social distancing, and we will check their temperatures when they get here.  I did remind the patient that their care partner needs to stay in the parking lot the entire time and have a cell phone available, we will call them when the pt is ready for discharge. Patient will wear mask into building.   No egg or soy allergy  No home oxygen use or problems with anesthesia  No medications for weight loss taken  emmi information given  I will call patient on Monday, 03-25-19, to set up his covid test.  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

## 2019-04-01 ENCOUNTER — Encounter: Payer: Managed Care, Other (non HMO) | Admitting: Internal Medicine

## 2019-04-04 ENCOUNTER — Encounter: Payer: Self-pay | Admitting: Nurse Practitioner
